# Patient Record
Sex: Male | Born: 1960 | Race: White | Hispanic: No | Marital: Married | Smoking: Current every day smoker
Health system: Southern US, Community
[De-identification: ages and names within clinical notes are randomized; demographics above are authoritative.]

## PROBLEM LIST (undated history)

## (undated) DIAGNOSIS — N200 Calculus of kidney: Secondary | ICD-10-CM

## (undated) HISTORY — PX: NECK SURGERY: SHX720

---

## 2010-11-26 ENCOUNTER — Ambulatory Visit: Payer: Self-pay

## 2013-09-22 ENCOUNTER — Emergency Department: Payer: Self-pay | Admitting: Emergency Medicine

## 2016-06-17 ENCOUNTER — Emergency Department: Payer: Medicare HMO

## 2016-06-17 ENCOUNTER — Encounter: Payer: Self-pay | Admitting: Emergency Medicine

## 2016-06-17 ENCOUNTER — Emergency Department
Admission: EM | Admit: 2016-06-17 | Discharge: 2016-06-17 | Disposition: A | Discharge status: Home / Self Care | Payer: Medicare HMO | Attending: Student in an Organized Health Care Education/Training Program | Admitting: Student in an Organized Health Care Education/Training Program

## 2016-06-17 DIAGNOSIS — R109 Unspecified abdominal pain: Secondary | ICD-10-CM

## 2016-06-17 DIAGNOSIS — N201 Calculus of ureter: Secondary | ICD-10-CM | POA: Diagnosis not present

## 2016-06-17 HISTORY — DX: Calculus of kidney: N20.0

## 2016-06-17 LAB — CBC WITH DIFFERENTIAL/PLATELET
Basophils Absolute: 0 10*3/uL (ref 0–0.1)
Basophils Relative: 0 %
EOS ABS: 0 10*3/uL (ref 0–0.7)
EOS PCT: 0 %
HCT: 46.5 % (ref 40.0–52.0)
Hemoglobin: 16.2 g/dL (ref 13.0–18.0)
LYMPHS ABS: 1.1 10*3/uL (ref 1.0–3.6)
Lymphocytes Relative: 11 %
MCH: 30.9 pg (ref 26.0–34.0)
MCHC: 34.9 g/dL (ref 32.0–36.0)
MCV: 88.6 fL (ref 80.0–100.0)
MONO ABS: 0.6 10*3/uL (ref 0.2–1.0)
MONOS PCT: 6 %
Neutro Abs: 8.1 10*3/uL — ABNORMAL HIGH (ref 1.4–6.5)
Neutrophils Relative %: 83 %
PLATELETS: 192 10*3/uL (ref 150–440)
RBC: 5.25 MIL/uL (ref 4.40–5.90)
RDW: 13.5 % (ref 11.5–14.5)
WBC: 9.9 10*3/uL (ref 3.8–10.6)

## 2016-06-17 LAB — COMPREHENSIVE METABOLIC PANEL
ALT: 40 U/L (ref 17–63)
ANION GAP: 7 (ref 5–15)
AST: 35 U/L (ref 15–41)
Albumin: 4 g/dL (ref 3.5–5.0)
Alkaline Phosphatase: 71 U/L (ref 38–126)
BUN: 11 mg/dL (ref 6–20)
CALCIUM: 9.1 mg/dL (ref 8.9–10.3)
CO2: 25 mmol/L (ref 22–32)
Chloride: 104 mmol/L (ref 101–111)
Creatinine, Ser: 1.08 mg/dL (ref 0.61–1.24)
Glucose, Bld: 149 mg/dL — ABNORMAL HIGH (ref 65–99)
Potassium: 4 mmol/L (ref 3.5–5.1)
SODIUM: 136 mmol/L (ref 135–145)
Total Bilirubin: 0.7 mg/dL (ref 0.3–1.2)
Total Protein: 7.7 g/dL (ref 6.5–8.1)

## 2016-06-17 LAB — URINALYSIS, COMPLETE (UACMP) WITH MICROSCOPIC
BILIRUBIN URINE: NEGATIVE
GLUCOSE, UA: NEGATIVE mg/dL
Ketones, ur: NEGATIVE mg/dL
LEUKOCYTES UA: NEGATIVE
NITRITE: NEGATIVE
PH: 8 (ref 5.0–8.0)
Protein, ur: NEGATIVE mg/dL
SPECIFIC GRAVITY, URINE: 1.012 (ref 1.005–1.030)
Squamous Epithelial / LPF: NONE SEEN

## 2016-06-17 MED ORDER — PROMETHAZINE HCL 12.5 MG PO TABS: 12.5000 mg | ORAL_TABLET | Freq: Four times a day (QID) | ORAL | 0 refills | Status: DC | PRN

## 2016-06-17 MED ORDER — MORPHINE SULFATE (PF) 4 MG/ML IV SOLN
4.0000 mg | INTRAVENOUS | Status: DC | PRN
  Administered 2016-06-17: 4 mg via INTRAVENOUS

## 2016-06-17 MED ORDER — TAMSULOSIN HCL 0.4 MG PO CAPS: 0.4000 mg | ORAL_CAPSULE | Freq: Every day | ORAL | 0 refills | Status: DC

## 2016-06-17 MED ORDER — NAPROXEN 375 MG PO TABS: 375.0000 mg | ORAL_TABLET | Freq: Two times a day (BID) | ORAL | 0 refills | Status: AC

## 2016-06-17 MED ORDER — TAMSULOSIN HCL 0.4 MG PO CAPS
0.4000 mg | ORAL_CAPSULE | Freq: Every day | ORAL | Status: DC
  Administered 2016-06-17: 0.4 mg via ORAL
  Filled 2016-06-17: qty 1

## 2016-06-17 MED ORDER — MORPHINE SULFATE (PF) 4 MG/ML IV SOLN
4.0000 mg | INTRAVENOUS | Status: DC | PRN
  Administered 2016-06-17: 4 mg via INTRAVENOUS
  Filled 2016-06-17 (×2): qty 1

## 2016-06-17 MED ORDER — ONDANSETRON HCL 4 MG/2ML IJ SOLN
4.0000 mg | Freq: Once | INTRAMUSCULAR | Status: AC
  Administered 2016-06-17: 4 mg via INTRAVENOUS
  Filled 2016-06-17: qty 2

## 2016-06-17 MED ORDER — HYDROCODONE-ACETAMINOPHEN 5-325 MG PO TABS
1.0000 | ORAL_TABLET | Freq: Once | ORAL | Status: AC
  Administered 2016-06-17: 1 via ORAL
  Filled 2016-06-17: qty 1

## 2016-06-17 MED ORDER — HYDROCODONE-ACETAMINOPHEN 5-325 MG PO TABS: 2.0000 | ORAL_TABLET | Freq: Four times a day (QID) | ORAL | 0 refills | Status: DC | PRN

## 2016-06-17 MED ORDER — KETOROLAC TROMETHAMINE 30 MG/ML IJ SOLN
15.0000 mg | Freq: Once | INTRAMUSCULAR | Status: AC
  Administered 2016-06-17: 15 mg via INTRAVENOUS
  Filled 2016-06-17: qty 1

## 2016-06-17 NOTE — ED Triage Notes (Signed)
L flank pain began 4 am, history of kidney stones, denies burning with urination.

## 2016-06-17 NOTE — ED Notes (Signed)
Pt returned from CT °

## 2016-06-17 NOTE — ED Notes (Signed)
Left flank pain

## 2016-06-17 NOTE — ED Notes (Signed)
Pt connected back to CM with SR noted.. Pt is rocking back and forth on the stretcher with pain.. EDP aware..Marland Kitchen

## 2016-06-17 NOTE — ED Provider Notes (Signed)
[facility] Emergency Department Provider Note    First MD Initiated Contact with Patient 06/17/16 0820     (approximate)  I have reviewed the triage vital signs and the nursing notes.   HISTORY  Chief Complaint Flank Pain    HPI Paul Rowland is a 56 y.o. male with a history of narcotic abuse presents with acute left flank pain that awoke him from sleep. States he has a history of kidney stones years 6 paste started right around 4 AM it as 10 out of 10 in severity. Denies any dysuria or hematuria. No nausea or vomiting. States he can't sit still due to the pain. Does not tried anything for pain at home.   On review of his medical records from [facility] and care everywhere does not have any listed previous diagnosis of kidney stones.   Past Medical History:  Diagnosis Date  . Kidney stones    No family history on file. Past Surgical History:  Procedure Laterality Date  . NECK SURGERY     cant state what it was   There are no active problems to display for this patient.     Prior to Admission medications   Not on File    Allergies Penicillins    Social History Social History  Substance Use Topics  . Smoking status: Not on file  . Smokeless tobacco: Not on file  . Alcohol use Not on file    Review of Systems Patient denies headaches, rhinorrhea, blurry vision, numbness, shortness of breath, chest pain, edema, cough, abdominal pain, nausea, vomiting, diarrhea, dysuria, fevers, rashes or hallucinations unless otherwise stated above in HPI. ____________________________________________   PHYSICAL EXAM:  VITAL SIGNS: Vitals:   06/17/16 0830 06/17/16 0900  BP: (!) 148/71   Pulse: 74   Resp: 14 17  Temp:      Constitutional: Alert and oriented. uncomfortable appearing and in no acute distress. Eyes: Conjunctivae are normal. PERRL. EOMI. Head: Atraumatic. Nose: No congestion/rhinnorhea. Mouth/Throat: Mucous membranes are  moist.  Oropharynx non-erythematous. Neck: No stridor. Painless ROM. No cervical spine tenderness to palpation Hematological/Lymphatic/Immunilogical: No cervical lymphadenopathy. Cardiovascular: Normal rate, regular rhythm. Grossly normal heart sounds.  Good peripheral circulation. Respiratory: Normal respiratory effort.  No retractions. Lungs CTAB. Gastrointestinal: Soft and nontender. No distention. No abdominal bruits. + left CVA ttp. Musculoskeletal: No lower extremity tenderness nor edema.  No joint effusions. Neurologic:  Normal speech and language. No gross focal neurologic deficits are appreciated. No gait instability. Skin:  Skin is warm, dry and intact. No rash noted. Psychiatric: Mood and affect are normal. Speech and behavior are normal.  ____________________________________________   LABS (all labs ordered are listed, but only abnormal results are displayed)  Results for orders placed or performed during the hospital encounter of 06/17/16 (from the past 24 hour(s))  CBC with Differential/Platelet     Status: Abnormal   Collection Time: 06/17/16  8:29 AM  Result Value Ref Range   WBC 9.9 3.8 - 10.6 K/uL   RBC 5.25 4.40 - 5.90 MIL/uL   Hemoglobin 16.2 13.0 - 18.0 g/dL   HCT 16.1 09.6 - 04.5 %   MCV 88.6 80.0 - 100.0 fL   MCH 30.9 26.0 - 34.0 pg   MCHC 34.9 32.0 - 36.0 g/dL   RDW 40.9 81.1 - 91.4 %   Platelets 192 150 - 440 K/uL   Neutrophils Relative % 83 %   Neutro Abs 8.1 (H) 1.4 - 6.5 K/uL   Lymphocytes  Relative 11 %   Lymphs Abs 1.1 1.0 - 3.6 K/uL   Monocytes Relative 6 %   Monocytes Absolute 0.6 0.2 - 1.0 K/uL   Eosinophils Relative 0 %   Eosinophils Absolute 0.0 0 - 0.7 K/uL   Basophils Relative 0 %   Basophils Absolute 0.0 0 - 0.1 K/uL  Comprehensive metabolic panel     Status: Abnormal   Collection Time: 06/17/16  8:29 AM  Result Value Ref Range   Sodium 136 135 - 145 mmol/L   Potassium 4.0 3.5 - 5.1 mmol/L   Chloride 104 101 - 111 mmol/L   CO2 25 22 -  32 mmol/L   Glucose, Bld 149 (H) 65 - 99 mg/dL   BUN 11 6 - 20 mg/dL   Creatinine, Ser 1.61 0.61 - 1.24 mg/dL   Calcium 9.1 8.9 - 09.6 mg/dL   Total Protein 7.7 6.5 - 8.1 g/dL   Albumin 4.0 3.5 - 5.0 g/dL   AST 35 15 - 41 U/L   ALT 40 17 - 63 U/L   Alkaline Phosphatase 71 38 - 126 U/L   Total Bilirubin 0.7 0.3 - 1.2 mg/dL   GFR calc non Af Amer >60 >60 mL/min   GFR calc Af Amer >60 >60 mL/min   Anion gap 7 5 - 15   ____________________________________________ ____________________________________________  RADIOLOGY  I personally reviewed all radiographic images ordered to evaluate for the above acute complaints and reviewed radiology reports and findings.  These findings were personally discussed with the patient.  Please see medical record for radiology report.  ____________________________________________   PROCEDURES  Procedure(s) performed:  Procedures    Critical Care performed: no ____________________________________________   INITIAL IMPRESSION / ASSESSMENT AND PLAN / ED COURSE  Pertinent labs & imaging results that were available during my care of the patient were reviewed by me and considered in my medical decision making (see chart for details).  DDX: stone, pyelo, dissection, AAA, msk strain, colitis, drug seeking behavior  Paul Rowland is a 56 y.o. who presents to the ED with acute left flank pain with Hx of substance abuse. No fevers, no systemic symptoms. - urinary symptoms. Denies trauma or injury. Afebrile in ED. Exam as above. Flank TTP, otherwise abdominal exam is benign. No peritoneal signs. Possible kidney stone, cystitis, or pyelonephritis.   Checking urine. UA with no evidence of inf.  Large RBC. CT Stone with evidnence of 6mm left sided ureteral stone with urine extravasation.   Clinical Course as of Jun 17 1018  Mon Jun 17, 2016  0932 CT stone: 6 x 7 x 7 mm obstructing distal left ureteral stone located 6 cm proximal to the left  ureterovesical junction causing moderate to marked left hydroureteronephrosis with mild extravasation of urine.    [PR]  0940 Spoke with Dr. Mena Goes regarding the read of urine extravasation.  He recommmends normal management as this is not an uncommon finding.  Will arrange referral for follow up with their clinic.    [PR]    Clinical Course User Index [PR] Willy Eddy, MD    Clinical picture is not consistent with appendicitis, diverticulitis, pancreatitis, cholecystitis, bowel perforation, aortic dissection, splenic injury or acute abdominal process at this time.  Pain improved, tolerating PO. Repeat ABD exam benign, will plan supportive treatment and early follow up for recheck.  ____________________________________________   FINAL CLINICAL IMPRESSION(S) / ED DIAGNOSES  Final diagnoses:  Left flank pain  Ureterolithiasis      NEW MEDICATIONS STARTED DURING  THIS VISIT:  New Prescriptions   No medications on file     Note:  This document was prepared using Dragon voice recognition software and may include unintentional dictation errors.    Willy Eddy, MD 06/17/16 1020

## 2016-09-19 ENCOUNTER — Emergency Department
Admission: EM | Admit: 2016-09-19 | Discharge: 2016-09-19 | Disposition: A | Discharge status: Home / Self Care | Payer: Medicare HMO | Attending: Emergency Medicine | Admitting: Emergency Medicine

## 2016-09-19 DIAGNOSIS — R1030 Lower abdominal pain, unspecified: Secondary | ICD-10-CM | POA: Diagnosis present

## 2016-09-19 DIAGNOSIS — F17200 Nicotine dependence, unspecified, uncomplicated: Secondary | ICD-10-CM | POA: Insufficient documentation

## 2016-09-19 DIAGNOSIS — N201 Calculus of ureter: Secondary | ICD-10-CM | POA: Insufficient documentation

## 2016-09-19 LAB — CBC WITH DIFFERENTIAL/PLATELET
BASOS ABS: 0.1 10*3/uL (ref 0–0.1)
BASOS PCT: 1 %
EOS ABS: 0.2 10*3/uL (ref 0–0.7)
EOS PCT: 2 %
HEMATOCRIT: 46 % (ref 40.0–52.0)
Hemoglobin: 16.2 g/dL (ref 13.0–18.0)
Lymphocytes Relative: 21 %
Lymphs Abs: 1.8 10*3/uL (ref 1.0–3.6)
MCH: 31.3 pg (ref 26.0–34.0)
MCHC: 35.2 g/dL (ref 32.0–36.0)
MCV: 88.9 fL (ref 80.0–100.0)
MONO ABS: 0.8 10*3/uL (ref 0.2–1.0)
MONOS PCT: 10 %
Neutro Abs: 5.9 10*3/uL (ref 1.4–6.5)
Neutrophils Relative %: 66 %
PLATELETS: 188 10*3/uL (ref 150–440)
RBC: 5.17 MIL/uL (ref 4.40–5.90)
RDW: 14 % (ref 11.5–14.5)
WBC: 8.8 10*3/uL (ref 3.8–10.6)

## 2016-09-19 LAB — BASIC METABOLIC PANEL
ANION GAP: 6 (ref 5–15)
BUN: 13 mg/dL (ref 6–20)
CALCIUM: 9.2 mg/dL (ref 8.9–10.3)
CO2: 27 mmol/L (ref 22–32)
CREATININE: 1.17 mg/dL (ref 0.61–1.24)
Chloride: 103 mmol/L (ref 101–111)
Glucose, Bld: 112 mg/dL — ABNORMAL HIGH (ref 65–99)
Potassium: 4.1 mmol/L (ref 3.5–5.1)
Sodium: 136 mmol/L (ref 135–145)

## 2016-09-19 LAB — URINALYSIS, COMPLETE (UACMP) WITH MICROSCOPIC
BACTERIA UA: NONE SEEN
Bilirubin Urine: NEGATIVE
GLUCOSE, UA: NEGATIVE mg/dL
Ketones, ur: NEGATIVE mg/dL
NITRITE: NEGATIVE
PROTEIN: NEGATIVE mg/dL
SPECIFIC GRAVITY, URINE: 1.021 (ref 1.005–1.030)
SQUAMOUS EPITHELIAL / LPF: NONE SEEN
pH: 6 (ref 5.0–8.0)

## 2016-09-19 MED ORDER — TAMSULOSIN HCL 0.4 MG PO CAPS: 0.4000 mg | ORAL_CAPSULE | Freq: Every day | ORAL | 0 refills | Status: DC

## 2016-09-19 MED ORDER — HYDROMORPHONE HCL 1 MG/ML IJ SOLN
1.0000 mg | Freq: Once | INTRAMUSCULAR | Status: AC
  Administered 2016-09-19: 1 mg via INTRAVENOUS
  Filled 2016-09-19: qty 1

## 2016-09-19 MED ORDER — KETOROLAC TROMETHAMINE 30 MG/ML IJ SOLN
30.0000 mg | Freq: Once | INTRAMUSCULAR | Status: AC
  Administered 2016-09-19: 30 mg via INTRAVENOUS
  Filled 2016-09-19: qty 1

## 2016-09-19 MED ORDER — NAPROXEN 500 MG PO TABS: 500.0000 mg | ORAL_TABLET | Freq: Two times a day (BID) | ORAL | 2 refills | Status: AC

## 2016-09-19 MED ORDER — SODIUM CHLORIDE 0.9 % IV BOLUS (SEPSIS)
1000.0000 mL | Freq: Once | INTRAVENOUS | Status: AC
  Administered 2016-09-19: 1000 mL via INTRAVENOUS

## 2016-09-19 MED ORDER — ONDANSETRON 4 MG PO TBDP: 4.0000 mg | ORAL_TABLET | Freq: Three times a day (TID) | ORAL | 0 refills | Status: AC | PRN

## 2016-09-19 MED ORDER — HYDROCODONE-ACETAMINOPHEN 5-325 MG PO TABS: 1.0000 | ORAL_TABLET | ORAL | 0 refills | Status: AC | PRN

## 2016-09-19 MED ORDER — ONDANSETRON HCL 4 MG/2ML IJ SOLN
4.0000 mg | Freq: Once | INTRAMUSCULAR | Status: AC
  Administered 2016-09-19: 4 mg via INTRAVENOUS
  Filled 2016-09-19: qty 2

## 2016-09-19 NOTE — ED Triage Notes (Addendum)
Pt presents with LEFT flank pain since this morning. Pt with h/x of kidney stones, denies urinary s/x's. Pt appears very uncomfortable in [place], unable to sit still for more than a couple minutes at a time.

## 2016-09-19 NOTE — ED Notes (Signed)
Pt reports "the pain comes in waves", pt reports when the pain comes "it helps to bend over." Pt in a fetal position at this time, able to talk without difficulty. Pt A&O at this time.

## 2016-09-19 NOTE — ED Provider Notes (Signed)
[facility] Emergency Department Provider Note   ____________________________________________    I have reviewed the triage vital signs and the nursing notes.   HISTORY  Chief Complaint Flank Pain     HPI Paul Rowland is a 55 y.o. male who presents with complaints of left-sided flank pain. Patient reports in February of this year he had his first kidney stone and this feels very similar even more painful. He reports severe sharp left-sided pain which radiates into his groin. He also reports nausea and vomiting. Denies fevers or chills. Denies dysuria but does have some urinary hesitancy. Has never had any procedures done for kidney stones.Has not taken anything for this. The pain started last night   Past Medical History:  Diagnosis Date  . Kidney stones     There are no active problems to display for this patient.   Past Surgical History:  Procedure Laterality Date  . NECK SURGERY     cant state what it was    Prior to Admission medications   Medication Sig Start Date End Date Taking? Authorizing Provider  HYDROcodone-acetaminophen (NORCO/VICODIN) 5-325 MG tablet Take 1 tablet by mouth every 4 (four) hours as needed for moderate pain. 09/19/16   Jene Every, MD  naproxen (NAPROSYN) 500 MG tablet Take 1 tablet (500 mg total) by mouth 2 (two) times daily with a meal. 09/19/16   Jene Every, MD  ondansetron (ZOFRAN ODT) 4 MG disintegrating tablet Take 1 tablet (4 mg total) by mouth every 8 (eight) hours as needed for nausea or vomiting. 09/19/16   Jene Every, MD  tamsulosin (FLOMAX) 0.4 MG CAPS capsule Take 1 capsule (0.4 mg total) by mouth daily. 09/19/16   Jene Every, MD     Allergies Penicillins  No family history on file.  Social History Social History  Substance Use Topics  . Smoking status: Current Every Day Smoker  . Smokeless tobacco: Never Used  . Alcohol use Yes    Review of Systems  Constitutional: No  fever/chills Eyes: No visual changes.  ENT: No sore throat. Cardiovascular: Denies chest pain. Respiratory: Denies shortness of breath. Gastrointestinal: As above Genitourinary: Negative for dysuria. Musculoskeletal: Negative for back pain. Skin: Negative for rash. Neurological: Negative for headaches   ____________________________________________   PHYSICAL EXAM:  VITAL SIGNS: ED Triage Vitals  Enc Vitals Group     BP 09/19/16 0613 (!) 158/82     Pulse Rate 09/19/16 0613 90     Resp 09/19/16 0613 20     Temp 09/19/16 0613 97.9 F (36.6 C)     Temp Source 09/19/16 0613 Oral     SpO2 09/19/16 0613 98 %     Weight 09/19/16 0610 72.6 kg (160 lb)     Height 09/19/16 0610 1.702 m (5\' 7" )     Head Circumference --      Peak Flow --      Pain Score 09/19/16 0610 10     Pain Loc --      Pain Edu? --      Excl. in GC? --     Constitutional: Alert and oriented. Pleasant and interactive   Mouth/Throat: Mucous membranes are moist.    Cardiovascular: Normal rate, regular rhythm. Grossly normal heart sounds.  Good peripheral circulation. Respiratory: Normal respiratory effort.  No retractions. Lungs CTAB. Gastrointestinal: Soft and nontender. No distention.  No CVA tenderness. Genitourinary: deferred Musculoskeletal: No lower extremity tenderness nor edema.  Warm and well perfused Neurologic:  Normal  speech and language. No gross focal neurologic deficits are appreciated.  Skin:  Skin is warm, dry and intact. No rash noted. Psychiatric: Mood and affect are normal. Speech and behavior are normal.  ____________________________________________   LABS (all labs ordered are listed, but only abnormal results are displayed)  Labs Reviewed  BASIC METABOLIC PANEL - Abnormal; Notable for the following:       Result Value   Glucose, Bld 112 (*)    All other components within normal limits  URINALYSIS, COMPLETE (UACMP) WITH MICROSCOPIC - Abnormal; Notable for the following:     Color, Urine YELLOW (*)    APPearance CLEAR (*)    Hgb urine dipstick MODERATE (*)    Leukocytes, UA TRACE (*)    All other components within normal limits  CBC WITH DIFFERENTIAL/PLATELET   ____________________________________________  EKG  None ____________________________________________  RADIOLOGY  none ____________________________________________   PROCEDURES  Procedure(s) performed: No    Critical Care performed: No ____________________________________________   INITIAL IMPRESSION / ASSESSMENT AND PLAN / ED COURSE  Pertinent labs & imaging results that were available during my care of the patient were reviewed by me and considered in my medical decision making (see chart for details).  Patient's exam and history of present illness and urine consistent with ureterolithiasis. Given 1 mg of IV Dilaudid with some improvement, IV Toradol given as well  ----------------------------------------- 10:45 AM on 09/19/2016 -----------------------------------------  Patient reports he is pain free. Lab work is reassuring, no evidence of infection. I'll discharge him with analgesics, we discussed return precautions    ____________________________________________   FINAL CLINICAL IMPRESSION(S) / ED DIAGNOSES  Final diagnoses:  Ureterolithiasis      NEW MEDICATIONS STARTED DURING THIS VISIT:  New Prescriptions   HYDROCODONE-ACETAMINOPHEN (NORCO/VICODIN) 5-325 MG TABLET    Take 1 tablet by mouth every 4 (four) hours as needed for moderate pain.   NAPROXEN (NAPROSYN) 500 MG TABLET    Take 1 tablet (500 mg total) by mouth 2 (two) times daily with a meal.   ONDANSETRON (ZOFRAN ODT) 4 MG DISINTEGRATING TABLET    Take 1 tablet (4 mg total) by mouth every 8 (eight) hours as needed for nausea or vomiting.   TAMSULOSIN (FLOMAX) 0.4 MG CAPS CAPSULE    Take 1 capsule (0.4 mg total) by mouth daily.     Note:  This document was prepared using Dragon voice recognition software  and may include unintentional dictation errors.    Jene EveryKinner, Danyela Posas, MD 09/19/16 1046

## 2016-09-24 ENCOUNTER — Telehealth: Payer: Self-pay | Admitting: Urology

## 2016-09-24 DIAGNOSIS — Z01818 Encounter for other preprocedural examination: Secondary | ICD-10-CM

## 2016-09-24 NOTE — Telephone Encounter (Signed)
done

## 2016-09-25 ENCOUNTER — Ambulatory Visit
Admission: RE | Admit: 2016-09-25 | Discharge: 2016-09-25 | Disposition: A | Discharge status: Home / Self Care | Payer: Medicare HMO | Source: Ambulatory Visit | Attending: Urology | Admitting: Urology

## 2016-09-25 ENCOUNTER — Encounter: Payer: Self-pay | Admitting: Urology

## 2016-09-25 ENCOUNTER — Ambulatory Visit (INDEPENDENT_AMBULATORY_CARE_PROVIDER_SITE_OTHER): Payer: Medicare HMO | Admitting: Urology

## 2016-09-25 VITALS — BP 123/70 | HR 88 | Ht 67.0 in | Wt 160.0 lb

## 2016-09-25 DIAGNOSIS — N201 Calculus of ureter: Secondary | ICD-10-CM

## 2016-09-25 LAB — URINALYSIS, COMPLETE
Bilirubin, UA: NEGATIVE
Glucose, UA: NEGATIVE
Ketones, UA: NEGATIVE
Nitrite, UA: NEGATIVE
PH UA: 5 (ref 5.0–7.5)
Protein, UA: NEGATIVE
Specific Gravity, UA: 1.025 (ref 1.005–1.030)
Urobilinogen, Ur: 1 mg/dL (ref 0.2–1.0)

## 2016-09-25 LAB — MICROSCOPIC EXAMINATION: EPITHELIAL CELLS (NON RENAL): NONE SEEN /HPF (ref 0–10)

## 2016-09-25 MED ORDER — TAMSULOSIN HCL 0.4 MG PO CAPS: 0.4000 mg | ORAL_CAPSULE | Freq: Every day | ORAL | 0 refills | Status: AC

## 2016-09-25 MED ORDER — TAMSULOSIN HCL 0.4 MG PO CAPS: 0.4000 mg | ORAL_CAPSULE | Freq: Every day | ORAL | 3 refills | Status: AC

## 2016-09-25 NOTE — Progress Notes (Signed)
09/25/2016 10:41 AM   Paul Rowland 01/08/1961 [id number]  Referring provider: No referring provider defined for this encounter.  Chief Complaint  Patient presents with  . Nephrolithiasis    HPI: Consultation for left flank pain and microscopic hematuria. Patient developed left flank pain 09/19/2016. He went to the emergency department. His kidney function a white count were normal but his UA showed too numerous to count red blood cells. No imaging was done. He has not seen a stone pass. He has pain with voiding, intermittent flow, hesitancy, weak stream, and straining to void which he had Saturday and then a sudden release and pain free. However, similar symptoms yesterday.   In February 2018, he had similar pain and underwent CT scan of the abdomen and pelvis which revealed a 7 x 7 mm left mid ureteral stone. It may be visible on the scout but it is over the sacrum, Hounsfield units 941, skin to stone distance 8 cm prone. His white count and kidney function were normal and UA showed too numerous to count red blood cells. He never saw a stone pass.   No prior stone events.   UA today shows 0-2 red blood cells, few bacteria.      PMH: Past Medical History:  Diagnosis Date  . Kidney stones     Surgical History: Past Surgical History:  Procedure Laterality Date  . NECK SURGERY     cant state what it was    Home Medications:  Allergies as of 09/25/2016      Reactions   Influenza Vaccines    Penicillins Rash      Medication List       Accurate as of 09/25/16 10:41 AM. Always use your most recent med list.          HYDROcodone-acetaminophen 5-325 MG tablet Commonly known as:  NORCO/VICODIN Take 1 tablet by mouth every 4 (four) hours as needed for moderate pain.   naproxen 500 MG tablet Commonly known as:  NAPROSYN Take 1 tablet (500 mg total) by mouth 2 (two) times daily with a meal.   ondansetron 4 MG disintegrating tablet Commonly known as:  ZOFRAN ODT Take  1 tablet (4 mg total) by mouth every 8 (eight) hours as needed for nausea or vomiting.   tamsulosin 0.4 MG Caps capsule Commonly known as:  FLOMAX Take 1 capsule (0.4 mg total) by mouth daily.       Allergies:  Allergies  Allergen Reactions  . Influenza Vaccines   . Penicillins Rash    Family History: Family History  Problem Relation Age of Onset  . Prostate cancer Neg Hx   . Bladder Cancer Neg Hx   . Kidney cancer Neg Hx     Social History:  reports that he has been smoking.  He has never used smokeless tobacco. He reports that he drinks alcohol. He reports that he uses drugs.  ROS:                                        Physical Exam: BP 123/70 (BP Location: Left Arm, Patient Position: Sitting, Cuff Size: Normal)   Pulse 88   Ht 5\' 7"  (1.702 m)   Wt 72.6 kg (160 lb)   BMI 25.06 kg/m   Constitutional:  Alert and oriented, No acute distress. HEENT: [place] AT, moist mucus membranes.  Trachea midline, no masses. Cardiovascular: No clubbing,  cyanosis, or edema. Respiratory: Normal respiratory effort, no increased work of breathing. GI: Abdomen is soft, nontender, nondistended, no abdominal masses GU: No CVA tenderness. Skin: No rashes, bruises or suspicious lesions. Lymph: No cervical or inguinal adenopathy. Neurologic: Grossly intact, no focal deficits, moving all 4 extremities. Psychiatric: Normal mood and affect.  Laboratory Data: Lab Results  Component Value Date   WBC 8.8 09/19/2016   HGB 16.2 09/19/2016   HCT 46.0 09/19/2016   MCV 88.9 09/19/2016   PLT 188 09/19/2016    Lab Results  Component Value Date   CREATININE 1.17 09/19/2016    No results found for: PSA  No results found for: TESTOSTERONE  No results found for: HGBA1C  Urinalysis    Component Value Date/Time   COLORURINE YELLOW (A) 09/19/2016 0715   APPEARANCEUR CLEAR (A) 09/19/2016 0715   LABSPEC 1.021 09/19/2016 0715   PHURINE 6.0 09/19/2016 0715   GLUCOSEU  NEGATIVE 09/19/2016 0715   HGBUR MODERATE (A) 09/19/2016 0715   BILIRUBINUR NEGATIVE 09/19/2016 0715   KETONESUR NEGATIVE 09/19/2016 0715   PROTEINUR NEGATIVE 09/19/2016 0715   NITRITE NEGATIVE 09/19/2016 0715   LEUKOCYTESUR TRACE (A) 09/19/2016 0715    Pertinent Imaging: CT  Assessment & Plan:    1. Ureteral stone - tamsulosin refilled. Stone may have passed. Check KUB and renal US if needed. Discussed with patient if stone visible the nature r/b of continued stone passage, URS or ESWL discussing the relative success and complication rates. All questions answered. I drew him a picture of the anatomy and procedures. If the stone is visible he wants to plan ESWL for next week. He was a "pepsi nut" and has decreased intake.   - Urinalysis, Complete   No Follow-up on file.  Jerilee Field, MD  [facility] [address], [address] [place], [place] [address] [phone]

## 2016-09-27 ENCOUNTER — Other Ambulatory Visit: Payer: Self-pay | Admitting: Radiology

## 2016-09-27 NOTE — Telephone Encounter (Signed)
Spoke with patient and he states he would like to go ahead and schedule litho for next week if possible, he does not believe he will pass. Please contact patient to schedule he was told litho is done on Thursdays.

## 2016-09-27 NOTE — Telephone Encounter (Signed)
Spoke with pt about scheduling ESWL on 10/03/16 with arrival time to SDS at 6:30. Pt will RTC on 09/30/16 to fill out ESWL paperwork.

## 2016-09-27 NOTE — Telephone Encounter (Signed)
-----   Message from Jerilee FieldMatthew Eskridge, MD sent at 09/27/2016  9:46 AM EDT ----- Notify patient the stone is still there bit it made some slow progress. It's now down near the bladder on the left. We can set him up for shock wave as we discussed to go ahead and try to get rid of it or we can see in a couple of weeks with a renal US and KUB to give it some more time to pass. What does he want to do?   ----- Message ----- From: Ellin GoodieLowe, Casandra S, CMA Sent: 09/25/2016   4:39 PM To: Jerilee FieldMatthew Eskridge, MD    ----- Message ----- From: Interface, Rad Results In Sent: 09/25/2016   1:26 PM To: [facility]

## 2016-10-01 ENCOUNTER — Telehealth: Payer: Self-pay

## 2016-10-01 ENCOUNTER — Other Ambulatory Visit: Payer: Medicare HMO

## 2016-10-01 ENCOUNTER — Other Ambulatory Visit: Payer: Self-pay | Admitting: *Deleted

## 2016-10-01 ENCOUNTER — Other Ambulatory Visit: Payer: Self-pay | Admitting: Radiology

## 2016-10-01 ENCOUNTER — Ambulatory Visit
Admission: RE | Admit: 2016-10-01 | Discharge: 2016-10-01 | Disposition: A | Discharge status: Home / Self Care | Payer: Medicare HMO | Source: Ambulatory Visit | Attending: Urology | Admitting: Urology

## 2016-10-01 DIAGNOSIS — N201 Calculus of ureter: Secondary | ICD-10-CM | POA: Diagnosis present

## 2016-10-01 DIAGNOSIS — Z01818 Encounter for other preprocedural examination: Secondary | ICD-10-CM

## 2016-10-01 NOTE — Telephone Encounter (Signed)
-----   Message from Jerilee FieldMatthew Eskridge, MD sent at 10/01/2016 12:54 PM EDT ----- Patient came in today with his stone. Notify patient KUB confirms it passed and no other stones were identified. Can cancel ESWL. He can see us back as needed. Tell him to stay hydrated with 100 oz of water per da, limit animal protein to 8 oz and consume plenty of lemon or lime juice to prevent stones.    ----- Message ----- From: Lissa HoardWatts, [place] Michelle, CMA Sent: 10/01/2016  10:48 AM To: Jerilee FieldMatthew Eskridge, MD    ----- Message ----- From: Interface, Rad Results In Sent: 10/01/2016  10:41 AM To: [facility]

## 2016-10-01 NOTE — Telephone Encounter (Signed)
Patient notified and litho cancelled/SW

## 2016-10-01 NOTE — Telephone Encounter (Signed)
Orders entered

## 2016-10-15 ENCOUNTER — Ambulatory Visit: Payer: Medicare HMO

## 2017-12-04 IMAGING — CT CT RENAL STONE PROTOCOL
2 of 4 series · 15 of 46 positions shown, 17 images · non-contrast
Comparison: None.

CLINICAL DATA: 55-year-old male with left flank pain with nausea
and vomiting. Initial encounter.

EXAM:
CT ABDOMEN AND PELVIS WITHOUT CONTRAST
TECHNIQUE: Multidetector CT imaging of the abdomen and pelvis was performed
following the standard protocol without IV contrast.

[Series 3: stone full standard · axial · 0.68mm/px · z∈[-1229,-794]mm · 12 of 97 slices shown, 14 images]
[im 5/97  soft-tissue]
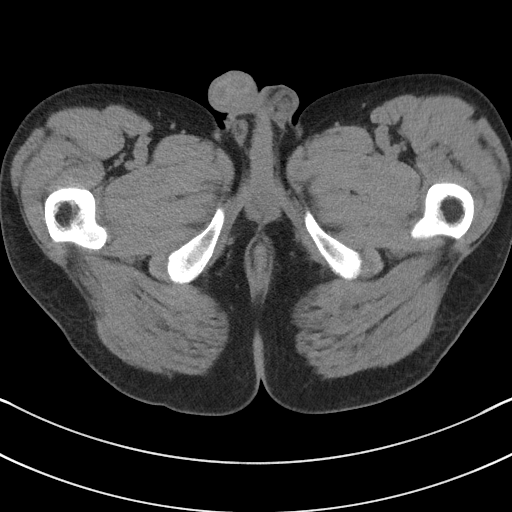
[im 5/97  bone]
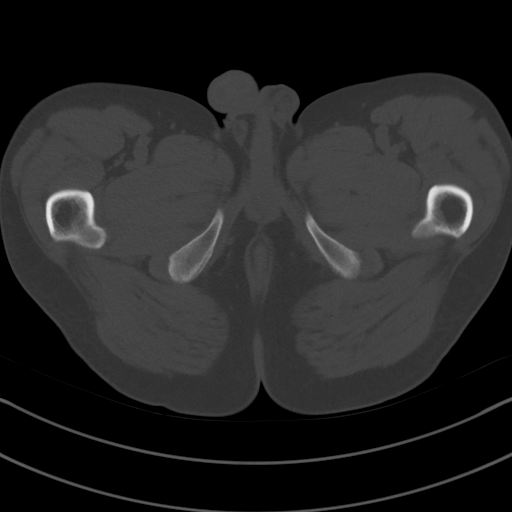
[im 13/97  soft-tissue]
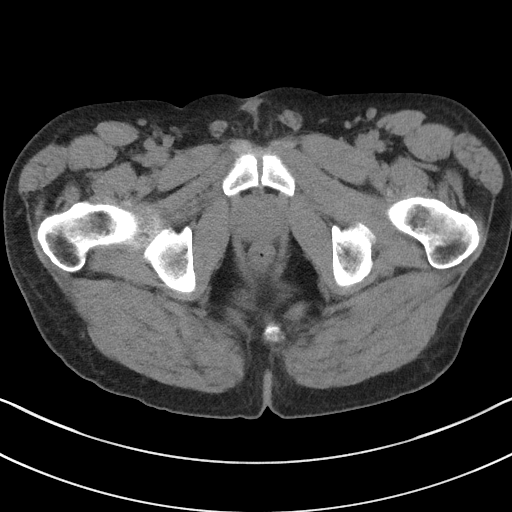
[im 21/97  soft-tissue]
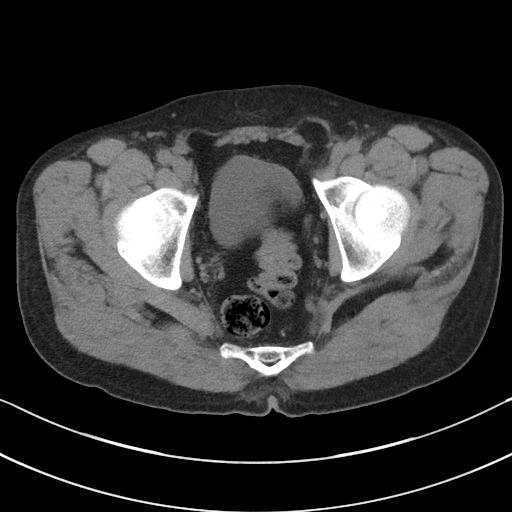
[im 30/97  soft-tissue]
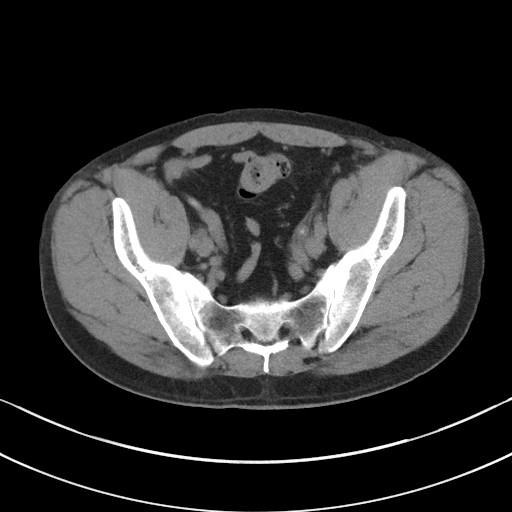
[im 38/97  soft-tissue]
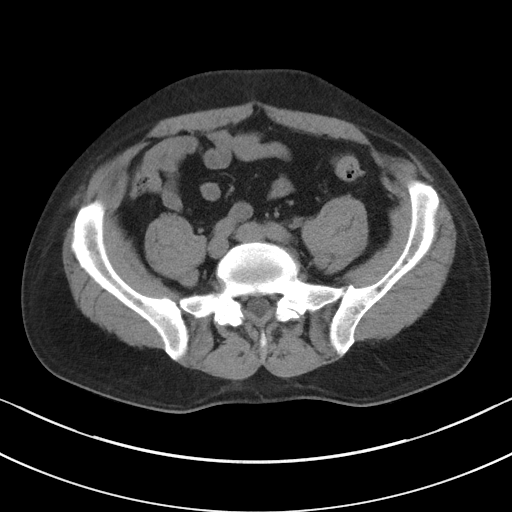
[im 46/97  soft-tissue]
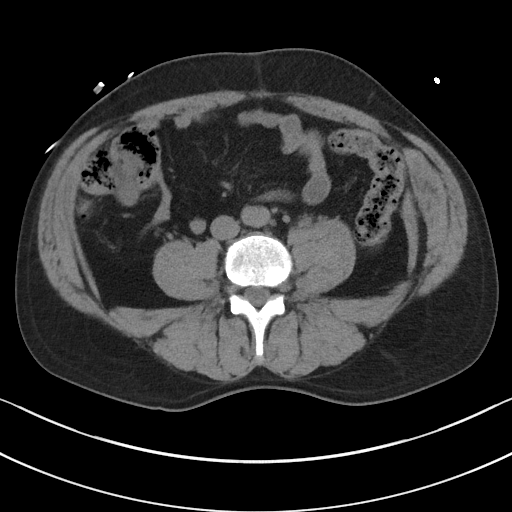
[im 51/97  soft-tissue]
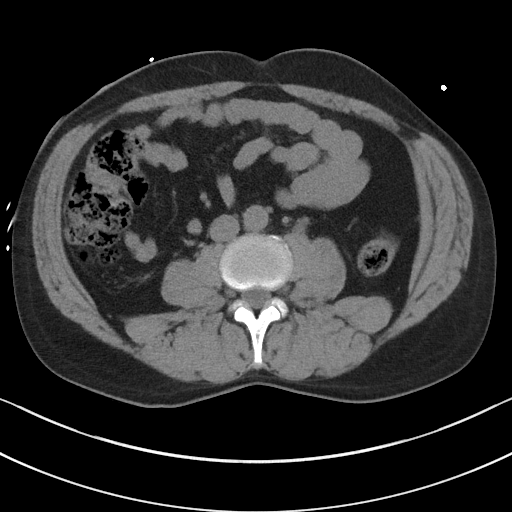
[im 59/97  soft-tissue]
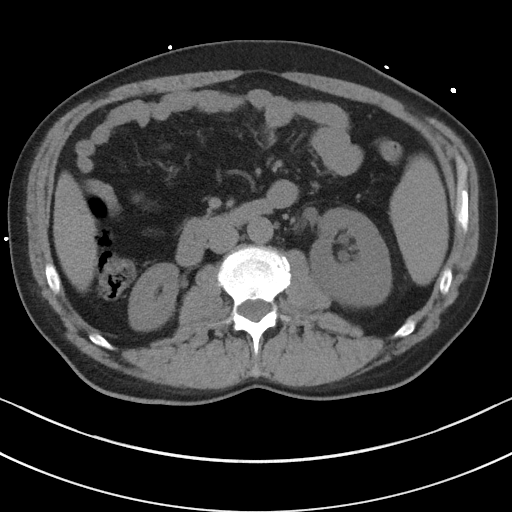
[im 67/97  soft-tissue]
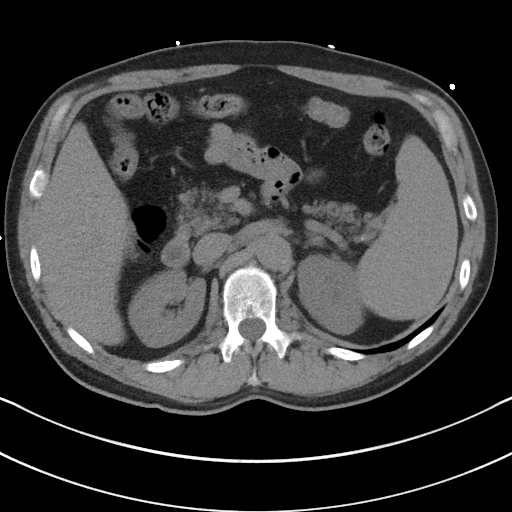
[im 67/97  bone]
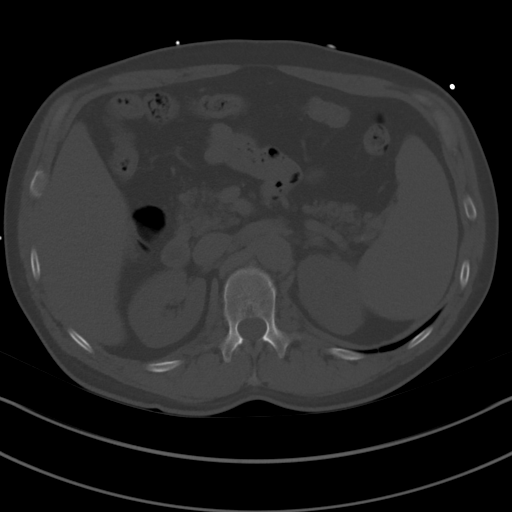
[im 76/97  soft-tissue]
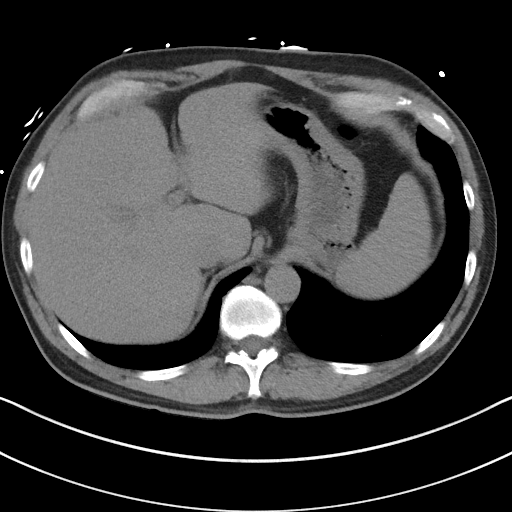
[im 84/97  soft-tissue]
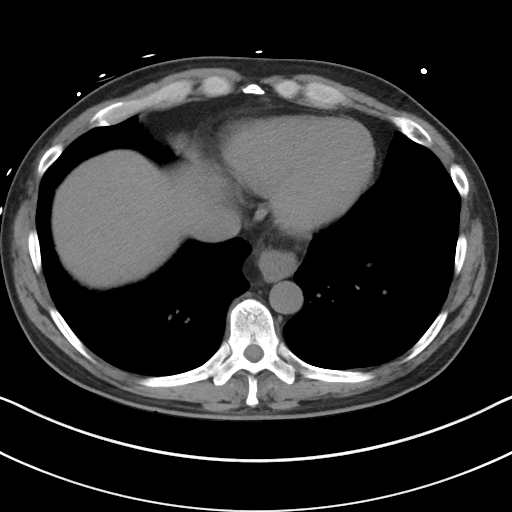
[im 92/97  soft-tissue]
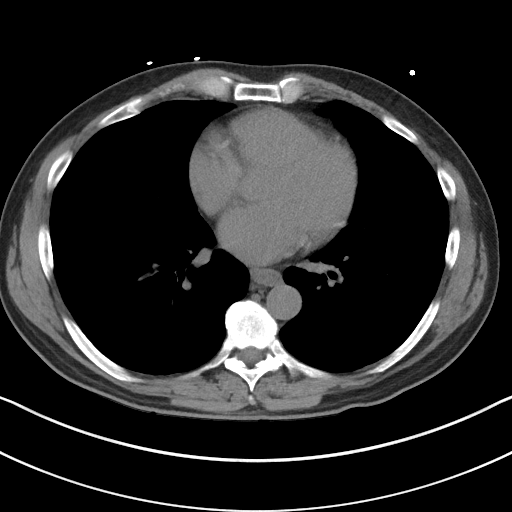

[Series 5: coronal · coronal · 0.71mm/px · 3 of 128 slices shown]
[im 43/128  soft-tissue]
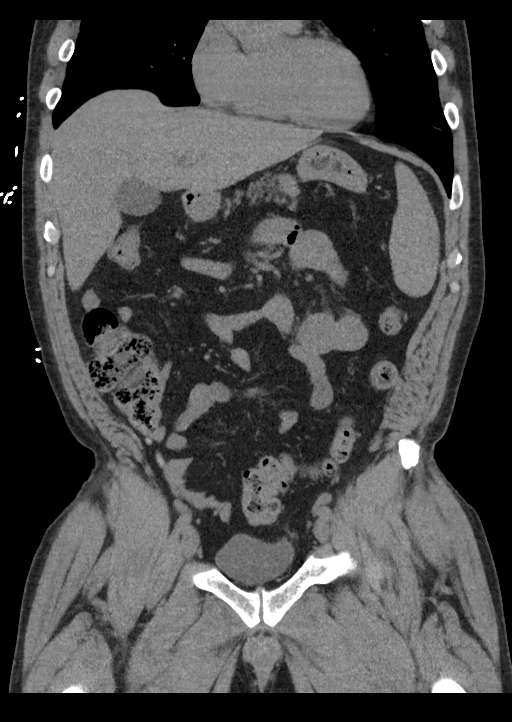
[im 57/128  soft-tissue]
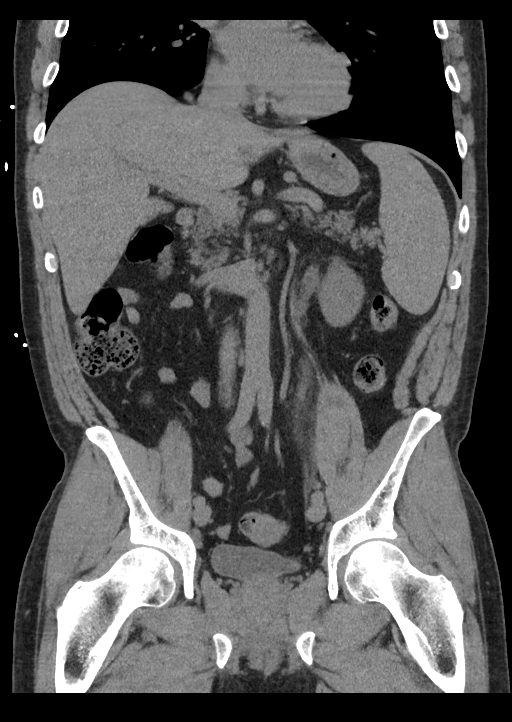
[im 71/128  soft-tissue]
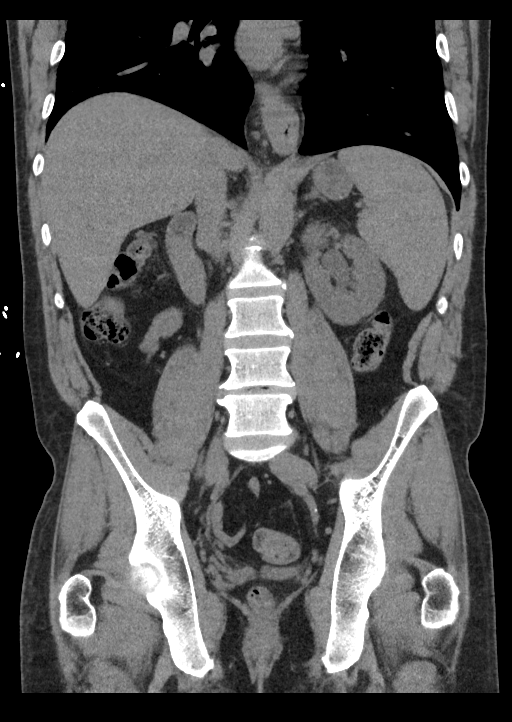

[15 of 46 positions shown; findings below may reference images not displayed]

FINDINGS: Lower chest: No worrisome lung base abnormality. Heart size within
normal limits.

Hepatobiliary: Taking into account limitation by non contrast
imaging, no worrisome hepatic lesion or calcified gallstones.

Pancreas: Taking into account limitation by non contrast imaging, no
mass or inflammation.

Spleen: Spleen top-normal in size spanning over 12.7 cm. Taking into
account limitation by non contrast imaging, no global mass.

Adrenals/Urinary Tract: 6 x 7 x 7 mm obstructing distal left
ureteral stone located 6 cm proximal to the left ureterovesical
junction causing moderate to marked left hydroureteronephrosis with
mild extravasation of urine.

Subcentimeter low-density structure anterior aspect right kidney
possibly a cyst although incompletely assessed and too small to
characterize.

No adrenal lesion.

Noncontrast filled under distended views of the urinary bladder
unremarkable.

Stomach/Bowel: Underdistended gastroesophageal junction limited
evaluation.

Portions of the colon are under distended and evaluation limited.

Scattered colonic diverticular.

No extraluminal bowel inflammatory process or free air.

Vascular/Lymphatic: Mild calcified plaque aorta and iliac arteries
without aneurysmal dilation. Scattered small lymph nodes without
adenopathy.

Reproductive: Negative

Other: Small fatty containing inguinal hernias larger on the left.

Musculoskeletal: Mild degenerative changes most notable L4-5 level.
IMPRESSION: 6 x 7 x 7 mm obstructing distal left ureteral stone located 6 cm
proximal to the left ureterovesical junction causing moderate to
marked left hydroureteronephrosis with mild extravasation of urine.

Possible small right renal cyst.

Top-normal size spleen spanning over 12.7 cm.

No extraluminal bowel inflammatory process.

Aortic atherosclerosis.

## 2018-03-14 IMAGING — CR DG ABDOMEN 1V
1 series · 2 of 2 positions shown · non-contrast
Comparison: 06/17/2016

CLINICAL DATA: Known left ureteral stone with pain, subsequent
encounter

EXAM:
ABDOMEN - 1 VIEW

[Series 1: dg abd 1 view · 0.14mm/px · 2 of 2 slices shown]
[im 1/2]
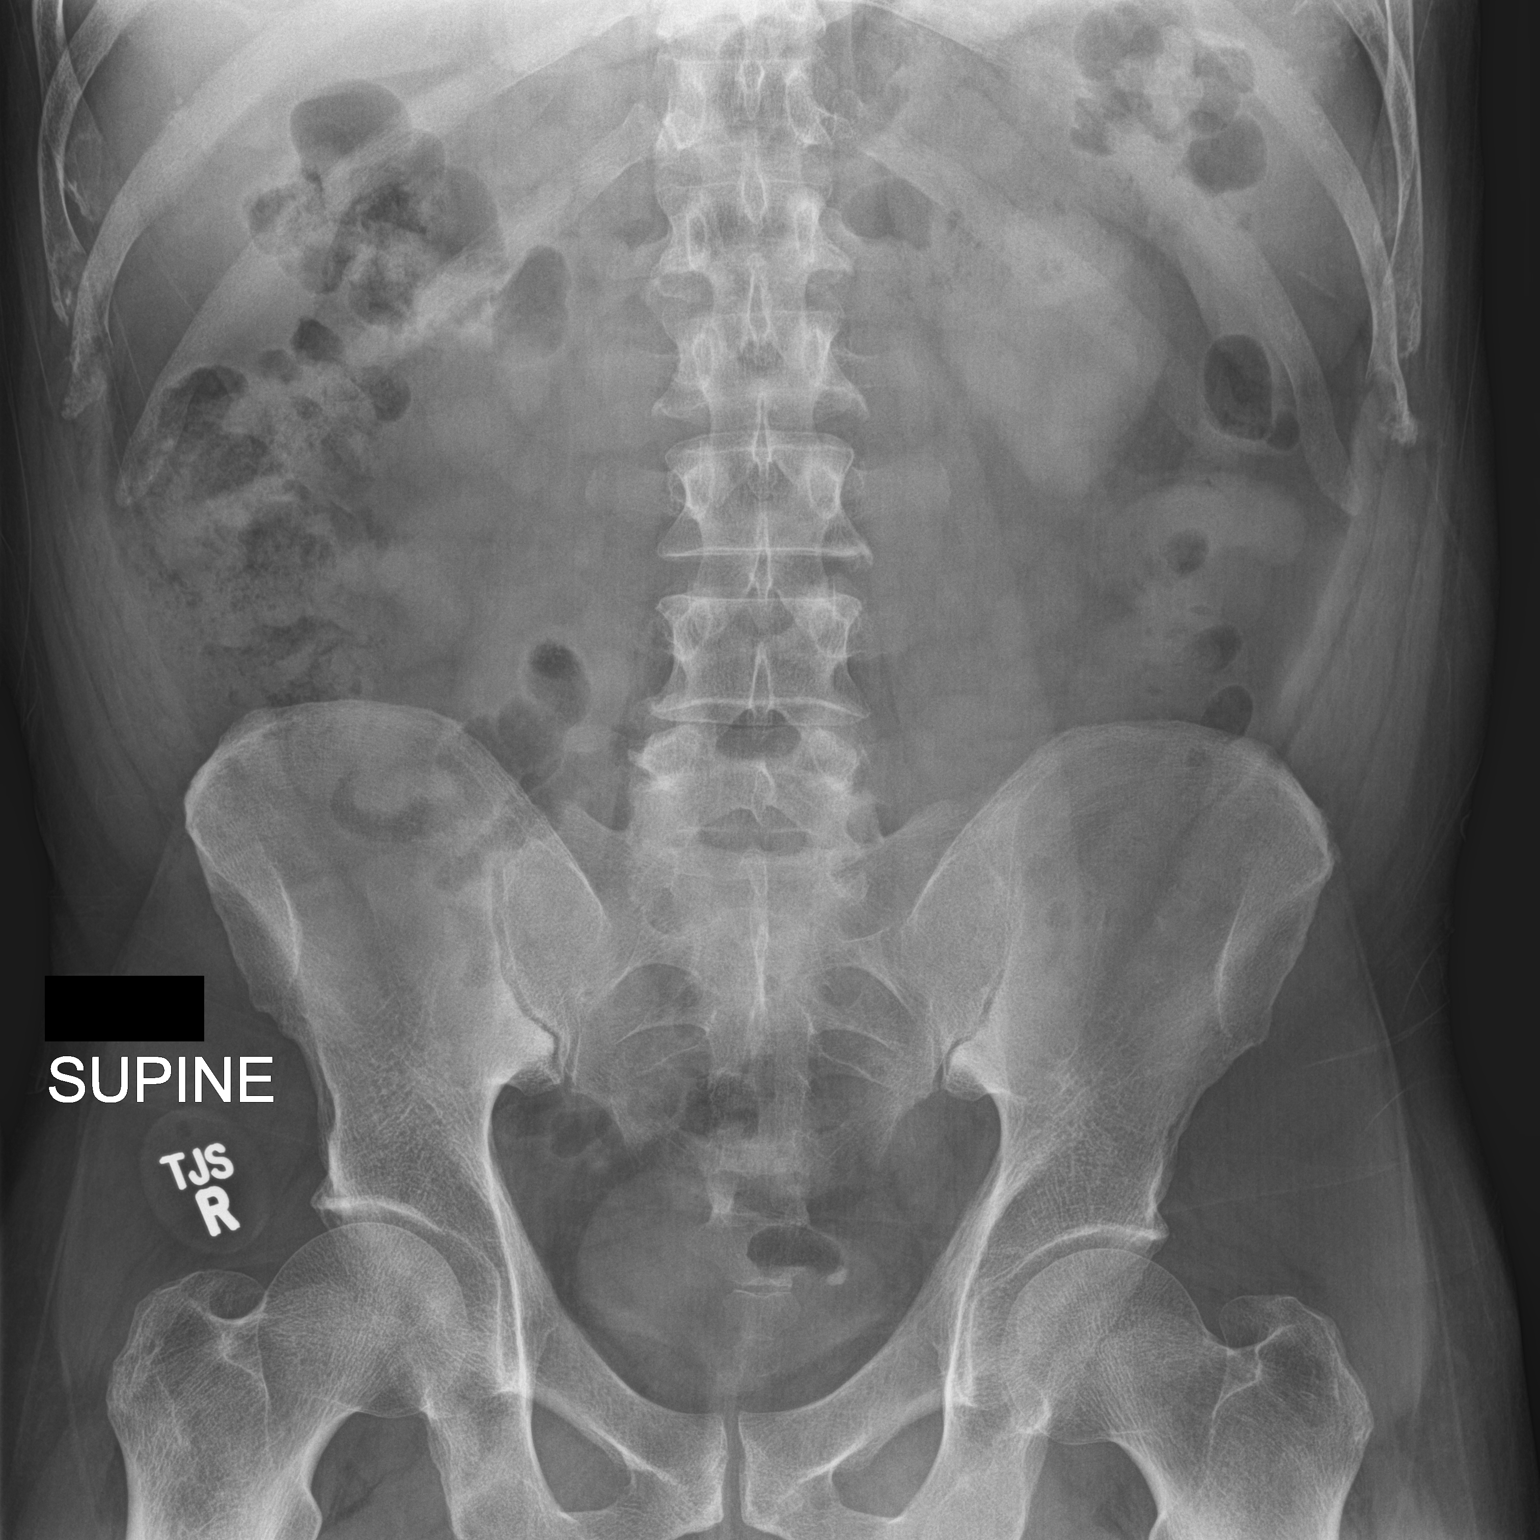
[im 2/2]
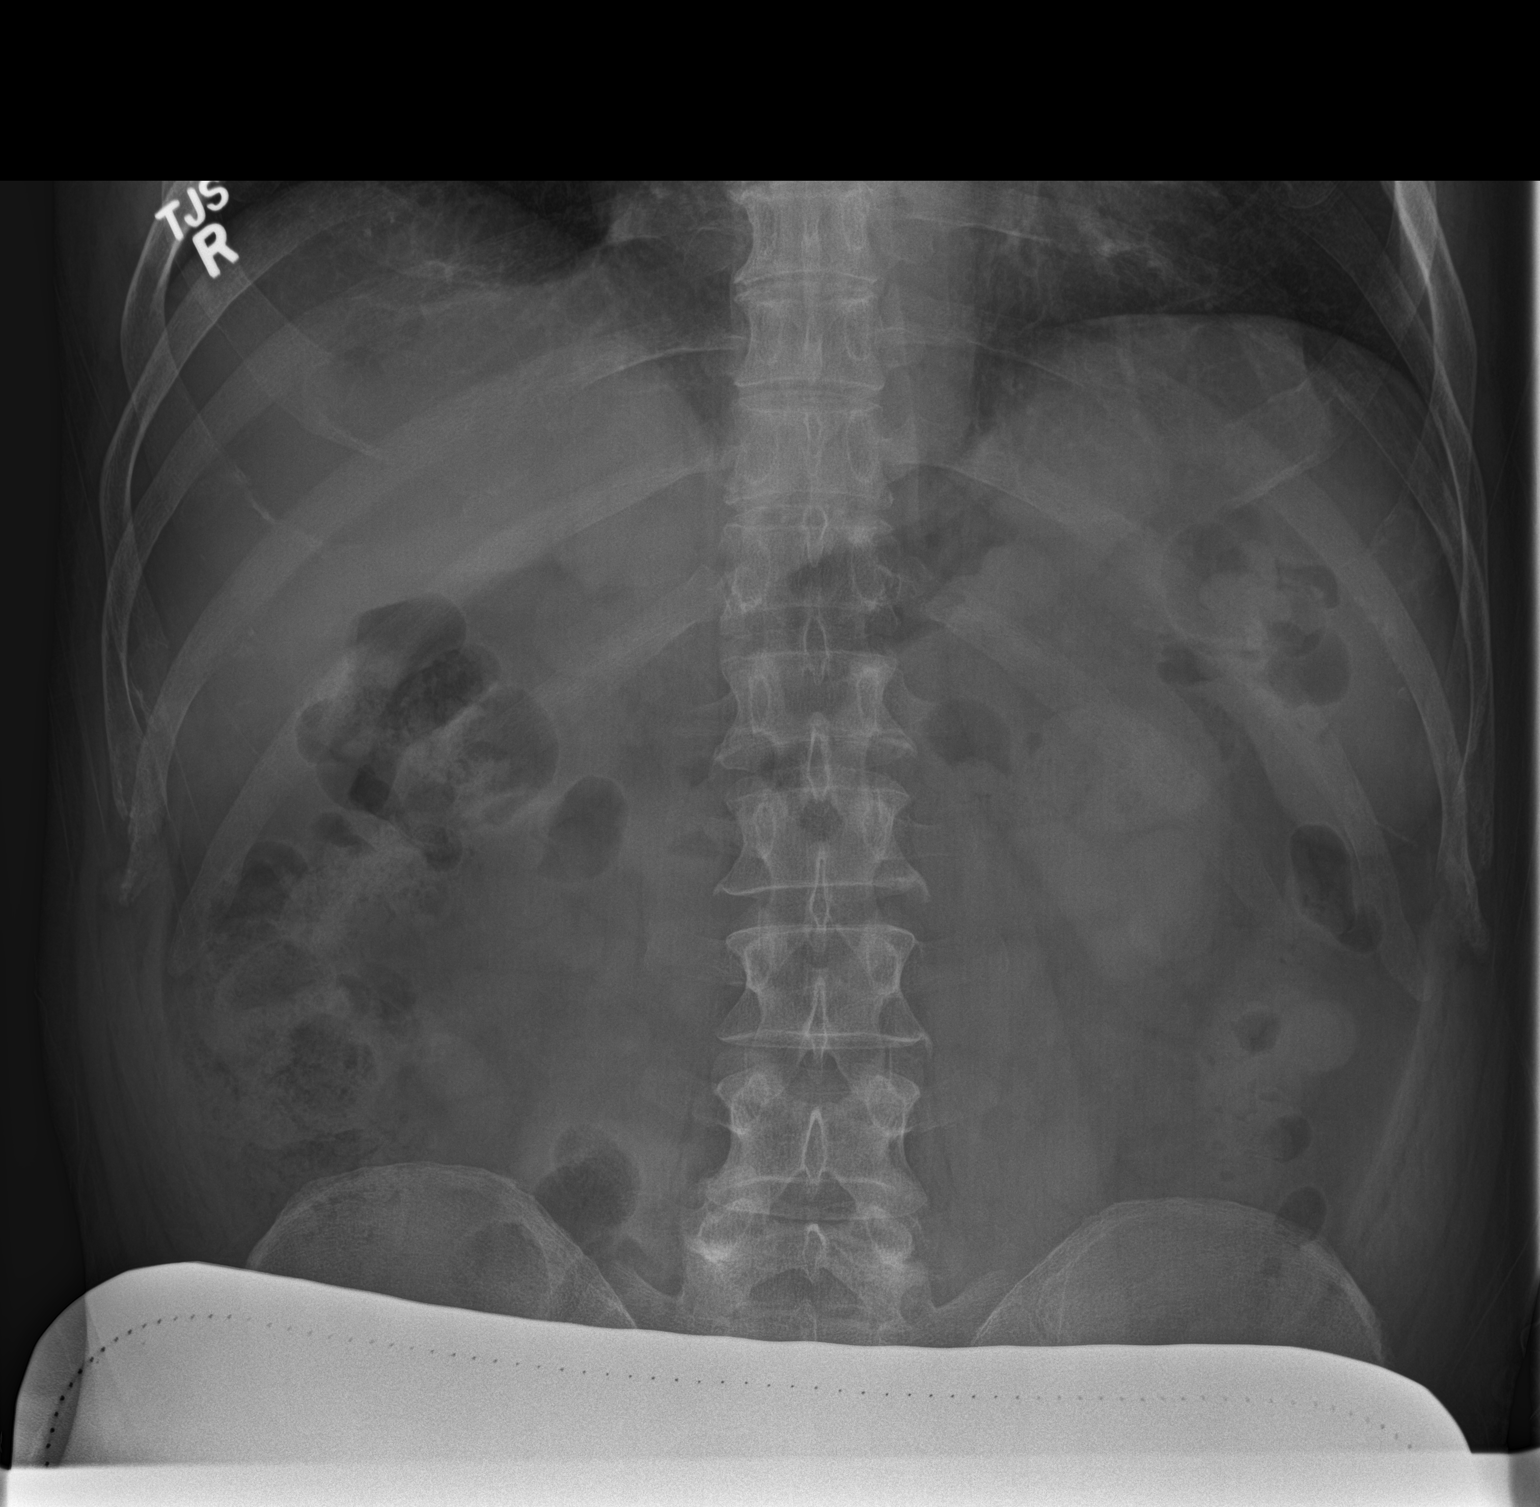

[2 of 2 positions shown; findings below may reference images not displayed]

FINDINGS: Scattered large and small bowel gas is noted. No abnormal mass is
seen. The known left ureteral stone has migrated distally to the
left ureterovesical junction. No renal calculi are seen. Mild
degenerative changes of the lumbar spine are noted.
IMPRESSION: Continued migration of left ureteral stone to the UVJ.

## 2018-03-20 IMAGING — CR DG ABDOMEN 1V
1 series · 2 of 2 positions shown · non-contrast
Comparison: 09/25/2016

CLINICAL DATA: Checking for left ureteral stone. Patient said that
he passed one stone last night so it may not be there. He has had a
history of stones since Monday May, 2016. Today he states that he is
not in much pain.

EXAM:
ABDOMEN - 1 VIEW

[Series 1: dg abd 1 view · 0.14mm/px · 2 of 2 slices shown]
[im 1/2]
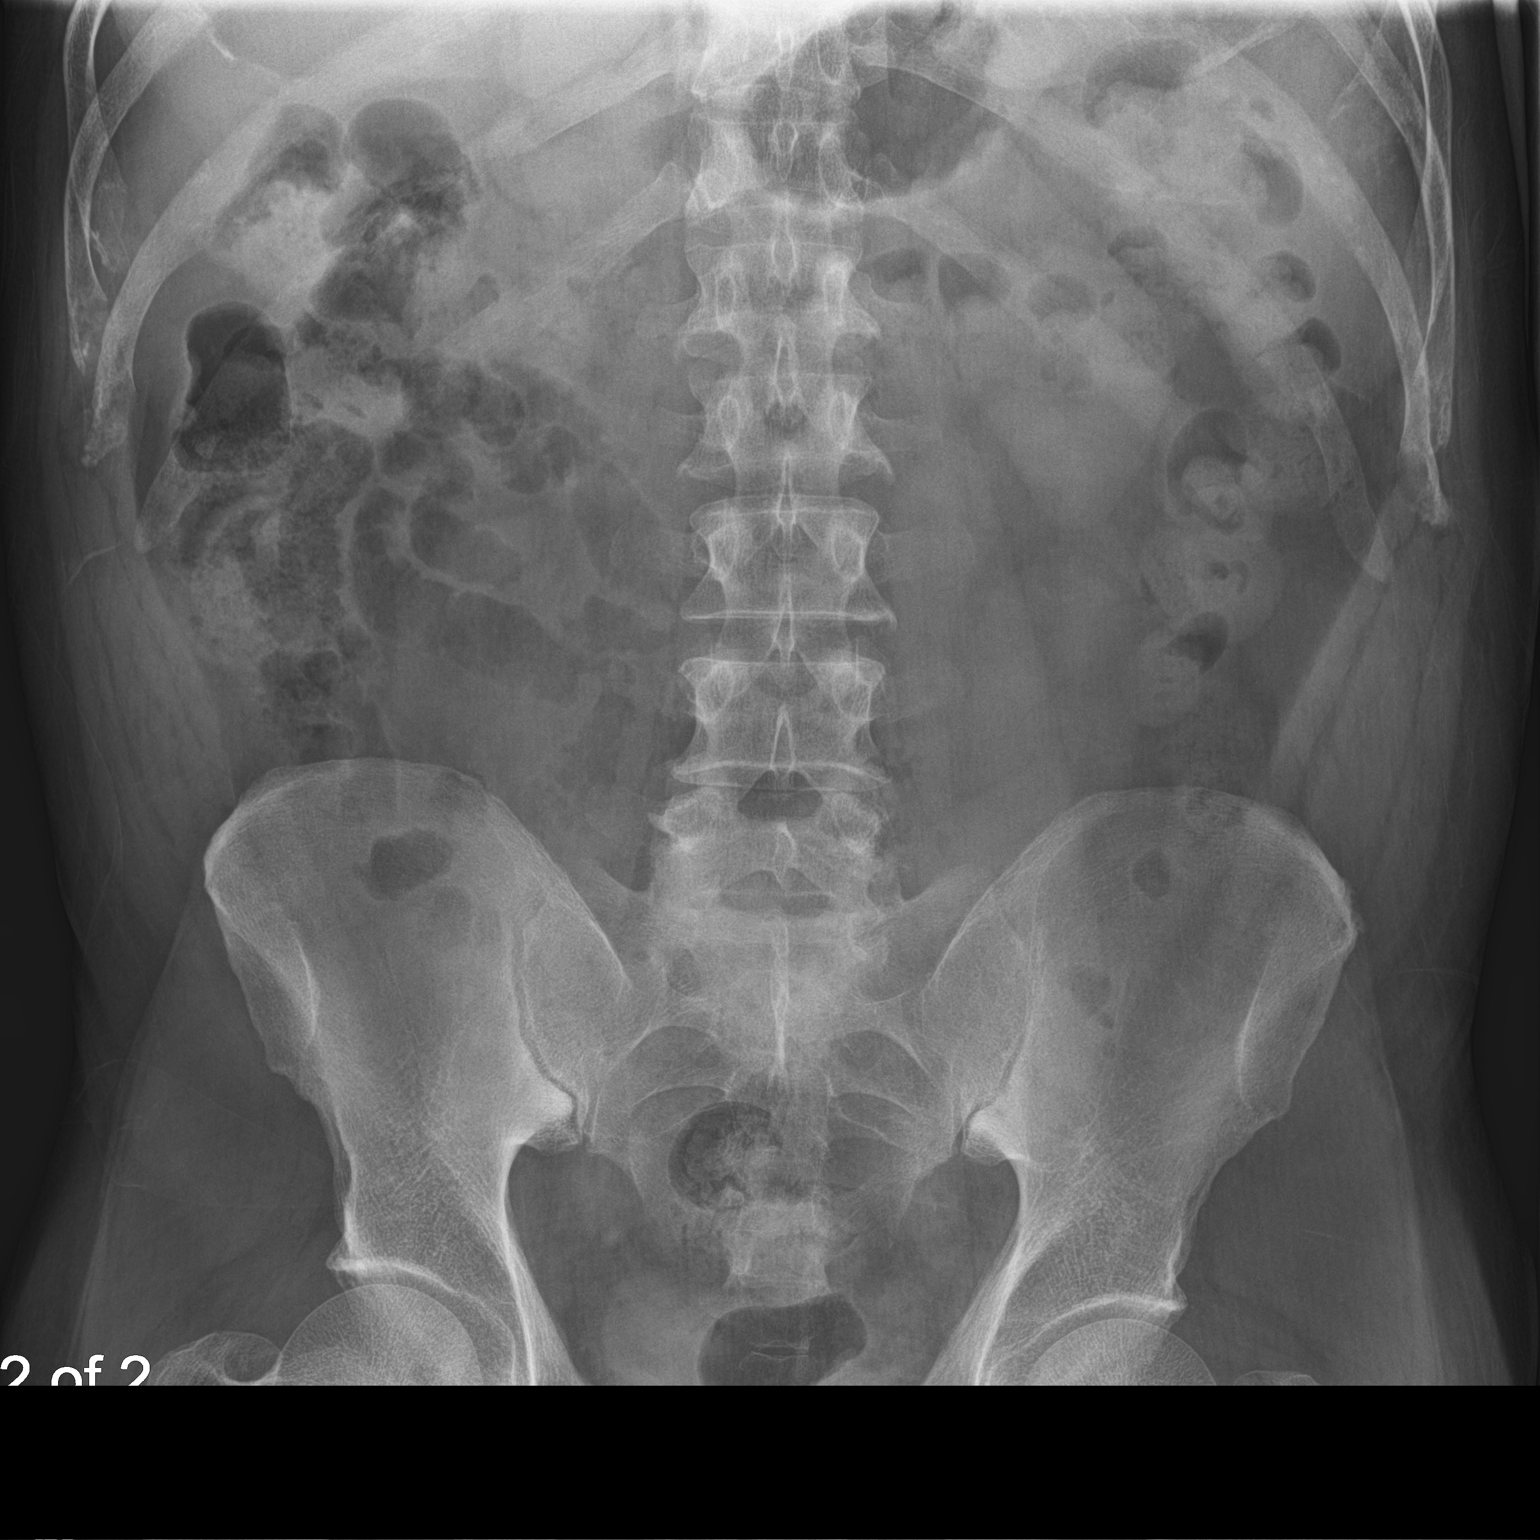
[im 2/2]
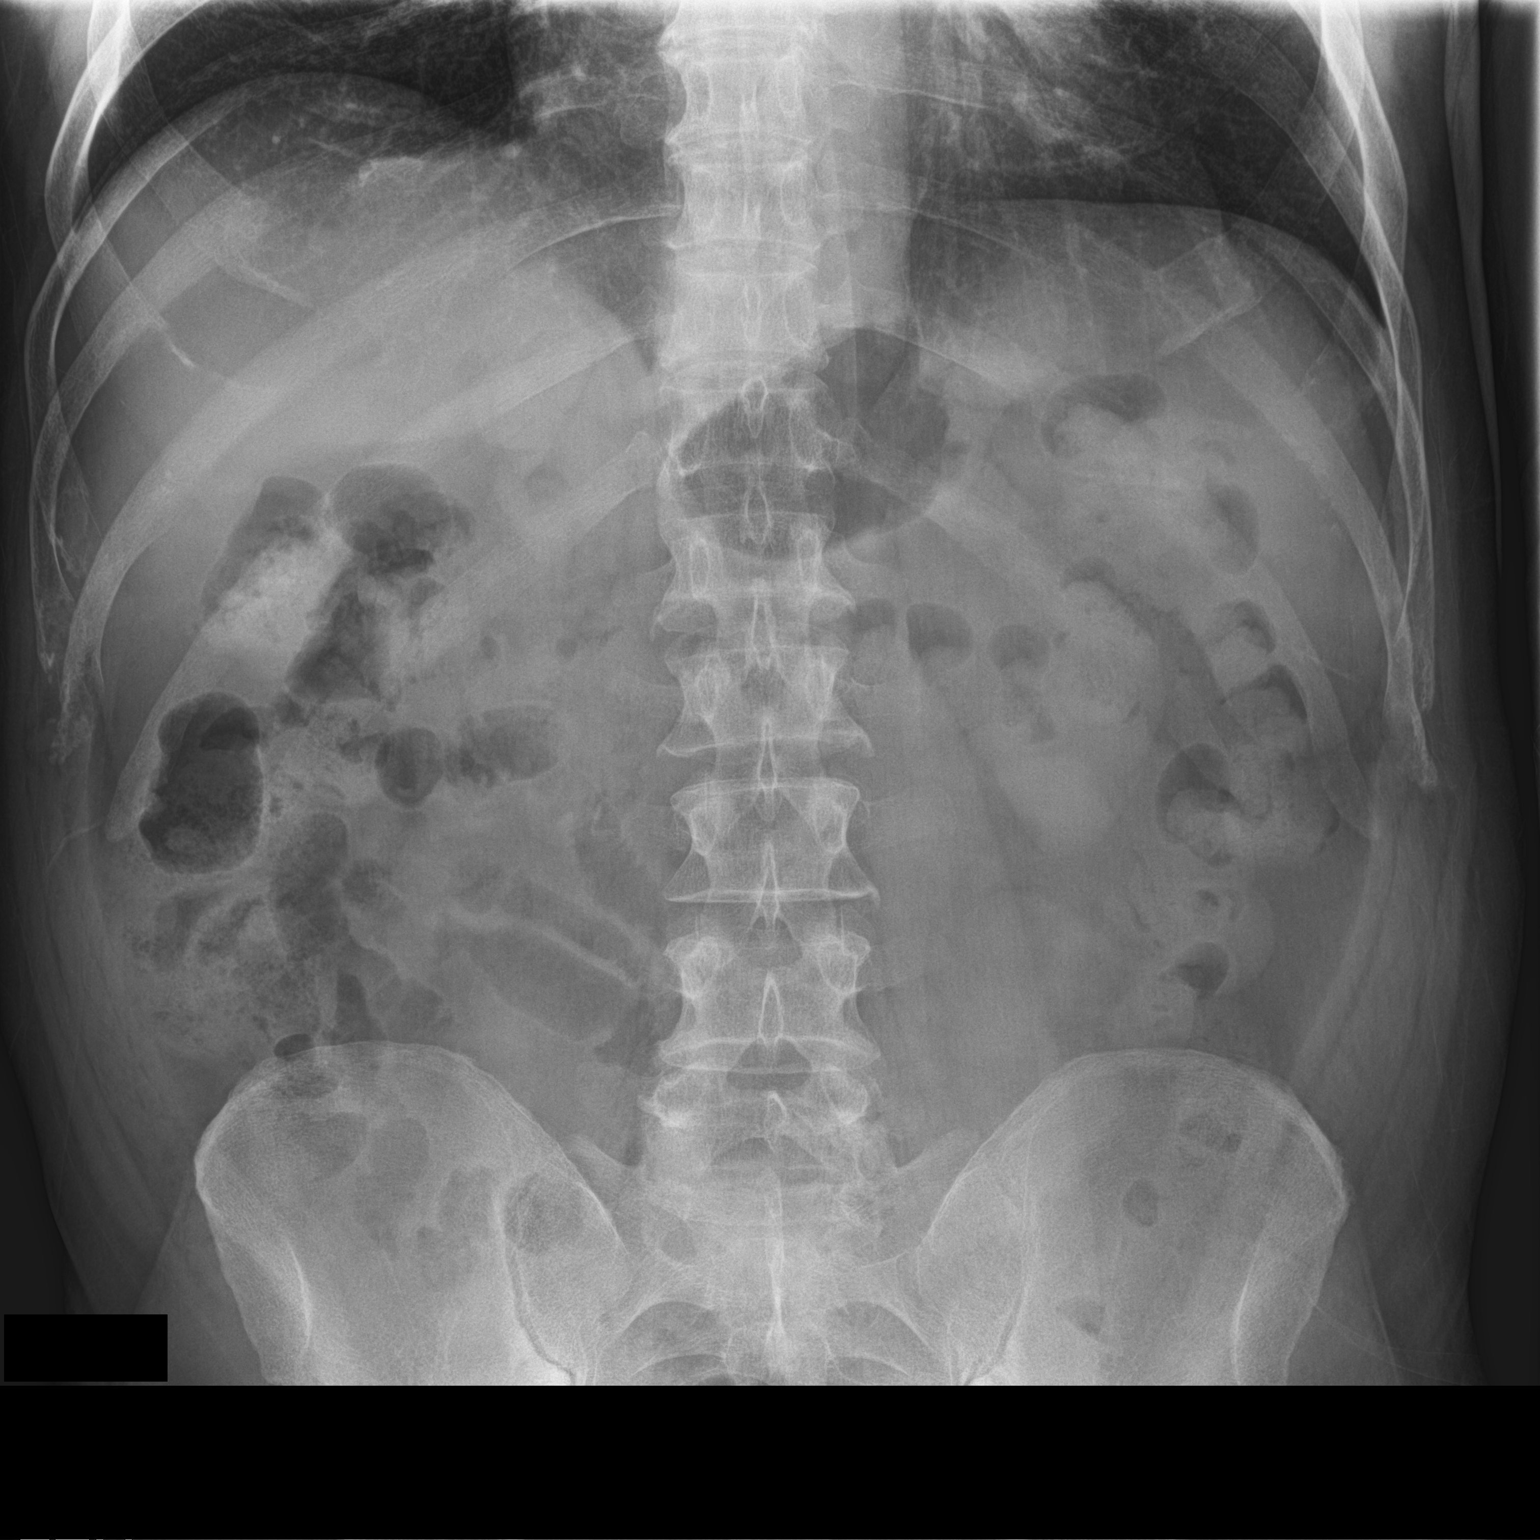

[2 of 2 positions shown; findings below may reference images not displayed]

FINDINGS: The stone seen in the left pelvis, consistent with a distal left
ureteral stone, on the prior study is no longer present consistent
with the interval passage.

There is no evidence of a renal or ureteral stone. The soft tissues
are unremarkable. No significant skeletal abnormality.
IMPRESSION: 1. Previously noted distal left ureteral stone has passed. No
current evidence of a renal or ureteral stone.

## 2019-05-05 ENCOUNTER — Ambulatory Visit: Payer: Medicare HMO | Attending: Internal Medicine

## 2019-05-05 DIAGNOSIS — Z20822 Contact with and (suspected) exposure to covid-19: Secondary | ICD-10-CM

## 2019-05-06 LAB — NOVEL CORONAVIRUS, NAA: SARS-CoV-2, NAA: NOT DETECTED
